# Patient Record
Sex: Female | Born: 1980 | Race: Black or African American | Hispanic: No | Marital: Single | Smoking: Former smoker
Health system: Southern US, Community
[De-identification: ages and names within clinical notes are randomized; demographics above are authoritative.]

---

## 2000-05-03 ENCOUNTER — Emergency Department (HOSPITAL_COMMUNITY): Admission: EM | Admit: 2000-05-03 | Discharge: 2000-05-03 | Payer: Self-pay | Admitting: *Deleted

## 2000-07-11 ENCOUNTER — Emergency Department (HOSPITAL_COMMUNITY): Admission: EM | Admit: 2000-07-11 | Discharge: 2000-07-11 | Payer: Self-pay | Admitting: Emergency Medicine

## 2003-07-21 ENCOUNTER — Emergency Department (HOSPITAL_COMMUNITY): Admission: EM | Admit: 2003-07-21 | Discharge: 2003-07-21 | Payer: Self-pay | Admitting: Emergency Medicine

## 2015-07-06 ENCOUNTER — Emergency Department (HOSPITAL_BASED_OUTPATIENT_CLINIC_OR_DEPARTMENT_OTHER): Payer: Medicaid Other

## 2015-07-06 ENCOUNTER — Emergency Department (HOSPITAL_BASED_OUTPATIENT_CLINIC_OR_DEPARTMENT_OTHER)
Admission: EM | Admit: 2015-07-06 | Discharge: 2015-07-06 | Disposition: A | Discharge status: Home / Self Care | Payer: Medicaid Other | Attending: Emergency Medicine | Admitting: Emergency Medicine

## 2015-07-06 ENCOUNTER — Encounter (HOSPITAL_BASED_OUTPATIENT_CLINIC_OR_DEPARTMENT_OTHER): Payer: Self-pay | Admitting: *Deleted

## 2015-07-06 DIAGNOSIS — Z791 Long term (current) use of non-steroidal anti-inflammatories (NSAID): Secondary | ICD-10-CM | POA: Diagnosis not present

## 2015-07-06 DIAGNOSIS — O26891 Other specified pregnancy related conditions, first trimester: Secondary | ICD-10-CM | POA: Insufficient documentation

## 2015-07-06 DIAGNOSIS — O211 Hyperemesis gravidarum with metabolic disturbance: Secondary | ICD-10-CM

## 2015-07-06 DIAGNOSIS — E860 Dehydration: Secondary | ICD-10-CM | POA: Insufficient documentation

## 2015-07-06 DIAGNOSIS — O0001 Abdominal pregnancy with intrauterine pregnancy: Secondary | ICD-10-CM | POA: Diagnosis not present

## 2015-07-06 DIAGNOSIS — M549 Dorsalgia, unspecified: Secondary | ICD-10-CM | POA: Diagnosis not present

## 2015-07-06 DIAGNOSIS — Z3A01 Less than 8 weeks gestation of pregnancy: Secondary | ICD-10-CM | POA: Insufficient documentation

## 2015-07-06 DIAGNOSIS — Z3491 Encounter for supervision of normal pregnancy, unspecified, first trimester: Secondary | ICD-10-CM

## 2015-07-06 DIAGNOSIS — Z79891 Long term (current) use of opiate analgesic: Secondary | ICD-10-CM | POA: Insufficient documentation

## 2015-07-06 DIAGNOSIS — O210 Mild hyperemesis gravidarum: Secondary | ICD-10-CM | POA: Diagnosis not present

## 2015-07-06 DIAGNOSIS — O99281 Endocrine, nutritional and metabolic diseases complicating pregnancy, first trimester: Secondary | ICD-10-CM | POA: Diagnosis not present

## 2015-07-06 DIAGNOSIS — Z87891 Personal history of nicotine dependence: Secondary | ICD-10-CM | POA: Insufficient documentation

## 2015-07-06 LAB — URINALYSIS, ROUTINE W REFLEX MICROSCOPIC
BILIRUBIN URINE: NEGATIVE
GLUCOSE, UA: NEGATIVE mg/dL
Hgb urine dipstick: NEGATIVE
KETONES UR: NEGATIVE mg/dL
Leukocytes, UA: NEGATIVE
NITRITE: NEGATIVE
PH: 8 (ref 5.0–8.0)
Protein, ur: NEGATIVE mg/dL
Specific Gravity, Urine: 1.018 (ref 1.005–1.030)

## 2015-07-06 LAB — CBC WITH DIFFERENTIAL/PLATELET
Band Neutrophils: 0 %
Basophils Absolute: 0 10*3/uL (ref 0.0–0.1)
Basophils Relative: 0 %
Blasts: 0 %
Eosinophils Absolute: 0 10*3/uL (ref 0.0–0.7)
Eosinophils Relative: 0 %
HEMATOCRIT: 36.8 % (ref 36.0–46.0)
HEMOGLOBIN: 11.9 g/dL — AB (ref 12.0–15.0)
Lymphocytes Relative: 27 %
Lymphs Abs: 1.4 10*3/uL (ref 0.7–4.0)
MCH: 23.6 pg — AB (ref 26.0–34.0)
MCHC: 32.3 g/dL (ref 30.0–36.0)
MCV: 73 fL — ABNORMAL LOW (ref 78.0–100.0)
MYELOCYTES: 0 %
Metamyelocytes Relative: 0 %
Monocytes Absolute: 0.4 10*3/uL (ref 0.1–1.0)
Monocytes Relative: 8 %
NEUTROS PCT: 65 %
NRBC: 0 /100{WBCs}
Neutro Abs: 3.2 10*3/uL (ref 1.7–7.7)
PROMYELOCYTES ABS: 0 %
Platelets: 145 10*3/uL — ABNORMAL LOW (ref 150–400)
RBC: 5.04 MIL/uL (ref 3.87–5.11)
RDW: 14.6 % (ref 11.5–15.5)
WBC: 5 10*3/uL (ref 4.0–10.5)

## 2015-07-06 LAB — COMPREHENSIVE METABOLIC PANEL
ALK PHOS: 34 U/L — AB (ref 38–126)
ALT: 11 U/L — AB (ref 14–54)
AST: 13 U/L — ABNORMAL LOW (ref 15–41)
Albumin: 4.3 g/dL (ref 3.5–5.0)
Anion gap: 8 (ref 5–15)
BILIRUBIN TOTAL: 0.8 mg/dL (ref 0.3–1.2)
BUN: 6 mg/dL (ref 6–20)
CALCIUM: 9.2 mg/dL (ref 8.9–10.3)
CO2: 26 mmol/L (ref 22–32)
CREATININE: 0.67 mg/dL (ref 0.44–1.00)
Chloride: 98 mmol/L — ABNORMAL LOW (ref 101–111)
Glucose, Bld: 98 mg/dL (ref 65–99)
Potassium: 3.7 mmol/L (ref 3.5–5.1)
Sodium: 132 mmol/L — ABNORMAL LOW (ref 135–145)
TOTAL PROTEIN: 7.4 g/dL (ref 6.5–8.1)

## 2015-07-06 LAB — HCG, QUANTITATIVE, PREGNANCY: HCG, BETA CHAIN, QUANT, S: 86141 m[IU]/mL — AB (ref <5)

## 2015-07-06 LAB — PREGNANCY, URINE: Preg Test, Ur: POSITIVE — AB

## 2015-07-06 MED ORDER — ONDANSETRON HCL 4 MG PO TABS: 4.0000 mg | ORAL_TABLET | Freq: Four times a day (QID) | ORAL | Status: AC

## 2015-07-06 MED ORDER — ONDANSETRON HCL 4 MG/2ML IJ SOLN
4.0000 mg | Freq: Once | INTRAMUSCULAR | Status: AC
  Administered 2015-07-06: 4 mg via INTRAVENOUS
  Filled 2015-07-06: qty 2

## 2015-07-06 MED ORDER — PRENATAL COMPLETE 14-0.4 MG PO TABS: 1.0000 | ORAL_TABLET | Freq: Every day | ORAL | Status: AC

## 2015-07-06 MED ORDER — SODIUM CHLORIDE 0.9 % IV BOLUS (SEPSIS)
1000.0000 mL | Freq: Once | INTRAVENOUS | Status: AC
  Administered 2015-07-06: 1000 mL via INTRAVENOUS

## 2015-07-06 NOTE — ED Provider Notes (Signed)
CSN: [id number]     Arrival date & time 07/06/15  1730 History  By signing my name below, I, Julie Hood, attest that this documentation has been prepared under the direction and in the presence of  . Electronically Signed: Freida Busmaniana Hood, Scribe. 07/06/2015. 6:03 PM.    Chief Complaint  Patient presents with  . Emesis    The history is provided by the patient. No language interpreter was used.   HPI Comments:  Julie BrickYolanda Blair is a 35 y.o. female who presents to the Emergency Department complaining of persistent nausea x 2 weeks. She has taken promethazine without relief. She has also taken a home pregnancy test that was positive; states she missed her last period but notes she has irregular periods. Pt notes symptom today is simialr to past UTI.    History reviewed. No pertinent past medical history. History reviewed. No pertinent past surgical history. History reviewed. No pertinent family history. Social History  Substance Use Topics  . Smoking status: Former Games developermoker  . Smokeless tobacco: None  . Alcohol Use: No   OB History    No data available     Review of Systems  Constitutional: Negative for fever and chills.  Gastrointestinal: Positive for nausea.  Genitourinary: Negative for dysuria, vaginal bleeding and vaginal discharge.  All other systems reviewed and are negative.  Allergies  Review of patient's allergies indicates no known allergies.  Home Medications   Prior to Admission medications   Medication Sig Start Date End Date Taking? Authorizing Provider  diclofenac (CATAFLAM) 50 MG tablet Take 50 mg by mouth 3 (three) times daily.   Yes Historical Provider, MD  oxyCODONE (OXY IR/ROXICODONE) 5 MG immediate release tablet Take 15 mg by mouth every 4 (four) hours as needed for severe pain.   Yes Historical Provider, MD  ondansetron (ZOFRAN) 4 MG tablet Take 1 tablet (4 mg total) by mouth every 6 (six) hours. 07/06/15   Jacalyn LefevreJulie Destaney Sarkis, MD  Prenatal Vit-Fe Fumarate-FA  (PRENATAL COMPLETE) 14-0.4 MG TABS Take 1 tablet by mouth daily. 07/06/15   Jacalyn LefevreJulie Dekota Shenk, MD   BP 108/57 mmHg  Pulse 82  Temp(Src) 98.1 F (36.7 C) (Oral)  Resp 18  Ht 5\' 7"  (1.702 m)  Wt 140 lb (63.504 kg)  BMI 21.92 kg/m2  SpO2 98%  LMP 05/19/2015 Physical Exam  Constitutional: She is oriented to person, place, and time. She appears well-developed and well-nourished. No distress.  HENT:  Head: Normocephalic and atraumatic.  Mouth/Throat: Mucous membranes are dry.  Eyes: Conjunctivae are normal.  Cardiovascular: Normal rate, regular rhythm and normal heart sounds.   Pulmonary/Chest: Effort normal and breath sounds normal. No respiratory distress.  Abdominal: Soft. She exhibits no distension. There is no tenderness.  Neurological: She is alert and oriented to person, place, and time.  Skin: Skin is warm and dry.  Psychiatric: She has a normal mood and affect.  Nursing note and vitals reviewed.   ED Course  Procedures (including critical care time)  DIAGNOSTIC STUDIES:  Oxygen Saturation is 99% on RA, normal by my interpretation.    COORDINATION OF CARE:  5:55 PM Discussed treatment plan with pt at bedside and pt agreed to plan.  Labs Review Labs Reviewed  PREGNANCY, URINE - Abnormal; Notable for the following:    Preg Test, Ur POSITIVE (*)    All other components within normal limits  HCG, QUANTITATIVE, PREGNANCY - Abnormal; Notable for the following:    hCG, Beta Nyra JabsChain, Quant, [place] [phone] (*)  All other components within normal limits  COMPREHENSIVE METABOLIC PANEL - Abnormal; Notable for the following:    Sodium 132 (*)    Chloride 98 (*)    AST 13 (*)    ALT 11 (*)    Alkaline Phosphatase 34 (*)    All other components within normal limits  CBC WITH DIFFERENTIAL/PLATELET - Abnormal; Notable for the following:    Hemoglobin 11.9 (*)    MCV 73.0 (*)    MCH 23.6 (*)    Platelets 145 (*)    All other components within normal limits  URINALYSIS, ROUTINE W  REFLEX MICROSCOPIC (NOT AT [facility])    Imaging Review US Ob Comp Less 14 Wks  07/06/2015  CLINICAL DATA:  Low back pain and nausea for 2 weeks. Irregular menses. Gestational age by LMP of 9 weeks 5 days. EXAM: OBSTETRIC <14 WK [place] AND TRANSVAGINAL OB US TECHNIQUE: Both transabdominal and transvaginal ultrasound examinations were performed for complete evaluation of the gestation as well as the maternal uterus, adnexal regions, and pelvic cul-de-sac. Transvaginal technique was performed to assess early pregnancy. COMPARISON:  None. FINDINGS: Intrauterine gestational sac: Single Yolk sac:  Visualized Embryo:  Visualized Cardiac Activity: Visualized Heart Rate: 145  bpm CRL:  11  mm   7 w   1 d                  [place] EDC: 02/21/2016 Subchorionic hemorrhage:  None visualized. Maternal uterus/adnexae: No fibroids identified. Right ovary is normal appearance. Probable left ovarian corpus luteum noted. No suspicious adnexal mass or abnormal free fluid identified. IMPRESSION: Single living IUP measuring 7 weeks 1 day with [place] EDC of 02/21/2016. This is approximately 2.5 weeks behind menstrual dates. Electronically Signed   By: Myles Rosenthal M.D.   On: 07/06/2015 19:37   US Ob Transvaginal  07/06/2015  CLINICAL DATA:  Low back pain and nausea for 2 weeks. Irregular menses. Gestational age by LMP of 9 weeks 5 days. EXAM: OBSTETRIC <14 WK [place] AND TRANSVAGINAL OB US TECHNIQUE: Both transabdominal and transvaginal ultrasound examinations were performed for complete evaluation of the gestation as well as the maternal uterus, adnexal regions, and pelvic cul-de-sac. Transvaginal technique was performed to assess early pregnancy. COMPARISON:  None. FINDINGS: Intrauterine gestational sac: Single Yolk sac:  Visualized Embryo:  Visualized Cardiac Activity: Visualized Heart Rate: 145  bpm CRL:  11  mm   7 w   1 d                  [place] EDC: 02/21/2016 Subchorionic hemorrhage:  None visualized. Maternal uterus/adnexae: No fibroids identified.  Right ovary is normal appearance. Probable left ovarian corpus luteum noted. No suspicious adnexal mass or abnormal free fluid identified. IMPRESSION: Single living IUP measuring 7 weeks 1 day with [place] EDC of 02/21/2016. This is approximately 2.5 weeks behind menstrual dates. Electronically Signed   By: Myles Rosenthal M.D.   On: 07/06/2015 19:37   I have personally reviewed and evaluated these images and lab results as part of my medical decision-making.   EKG Interpretation None      MDM  PT IS FEELING BETTER.  RESULTS OF [place] GIVEN TO PT.  PT KNOWS TO RETURN IF WORSE.  Final diagnoses:  Pregnancy related back pain in first trimester, antepartum  Hyperemesis gravidarum with dehydration  Normal IUP (intrauterine pregnancy) on prenatal ultrasound, first trimester   I personally performed the services described in this documentation, which was scribed in my presence. The  recorded information has been reviewed and is accurate.     Jacalyn Lefevre, MD 07/06/15 [phone]

## 2015-07-06 NOTE — ED Notes (Signed)
Pt c/o n/v/d  X 2 weeks , LMP 1.5 months ago

## 2016-05-08 ENCOUNTER — Other Ambulatory Visit: Payer: Self-pay | Admitting: Psychiatry

## 2016-05-08 DIAGNOSIS — M545 Low back pain, unspecified: Secondary | ICD-10-CM

## 2016-05-11 ENCOUNTER — Other Ambulatory Visit: Payer: Self-pay | Admitting: Psychiatry

## 2016-05-11 DIAGNOSIS — G8929 Other chronic pain: Secondary | ICD-10-CM

## 2016-05-11 DIAGNOSIS — M545 Low back pain: Principal | ICD-10-CM

## 2016-06-01 ENCOUNTER — Inpatient Hospital Stay
Admission: RE | Admit: 2016-06-01 | Discharge: 2016-06-01 | Disposition: A | Discharge status: Home / Self Care | Payer: Medicaid Other | Source: Ambulatory Visit | Attending: Psychiatry | Admitting: Psychiatry

## 2016-06-02 ENCOUNTER — Inpatient Hospital Stay
Admission: RE | Admit: 2016-06-02 | Discharge: 2016-06-02 | Disposition: A | Discharge status: Home / Self Care | Payer: Medicaid Other | Source: Ambulatory Visit | Attending: Psychiatry | Admitting: Psychiatry
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
## 2016-06-15 ENCOUNTER — Ambulatory Visit
Admission: RE | Admit: 2016-06-15 | Discharge: 2016-06-15 | Disposition: A | Discharge status: Home / Self Care | Payer: Medicaid Other | Source: Ambulatory Visit | Attending: Psychiatry | Admitting: Psychiatry

## 2016-06-15 DIAGNOSIS — M545 Low back pain, unspecified: Secondary | ICD-10-CM

## 2016-06-22 ENCOUNTER — Ambulatory Visit
Admission: RE | Admit: 2016-06-22 | Discharge: 2016-06-22 | Disposition: A | Discharge status: Home / Self Care | Payer: Medicaid Other | Source: Ambulatory Visit | Attending: Psychiatry | Admitting: Psychiatry

## 2016-06-22 DIAGNOSIS — M545 Low back pain: Principal | ICD-10-CM

## 2016-06-22 DIAGNOSIS — G8929 Other chronic pain: Secondary | ICD-10-CM

## 2016-06-22 MED ORDER — METHYLPREDNISOLONE ACETATE 40 MG/ML INJ SUSP (RADIOLOG: 120.0000 mg | Freq: Once | INTRAMUSCULAR | Status: DC

## 2016-06-22 MED ORDER — IOPAMIDOL (ISOVUE-M 200) INJECTION 41%: 1.0000 mL | Freq: Once | INTRAMUSCULAR | Status: DC

## 2016-06-22 NOTE — Discharge Instructions (Signed)

## 2016-06-28 LAB — GLUCOSE, POCT (MANUAL RESULT ENTRY): POC Glucose: 102 mg/dl — AB (ref 70–99)

## 2016-09-05 ENCOUNTER — Other Ambulatory Visit: Payer: Self-pay | Admitting: Psychiatry

## 2016-09-05 DIAGNOSIS — M545 Low back pain, unspecified: Secondary | ICD-10-CM

## 2016-09-14 ENCOUNTER — Ambulatory Visit
Admission: RE | Admit: 2016-09-14 | Discharge: 2016-09-14 | Disposition: A | Discharge status: Home / Self Care | Payer: Medicaid Other | Source: Ambulatory Visit | Attending: Psychiatry | Admitting: Psychiatry

## 2016-09-14 DIAGNOSIS — M545 Low back pain, unspecified: Secondary | ICD-10-CM

## 2016-09-14 MED ORDER — IOPAMIDOL (ISOVUE-M 200) INJECTION 41%
1.0000 mL | Freq: Once | INTRAMUSCULAR | Status: AC
  Administered 2016-09-14: 1 mL via EPIDURAL

## 2016-09-14 MED ORDER — METHYLPREDNISOLONE ACETATE 40 MG/ML INJ SUSP (RADIOLOG
120.0000 mg | Freq: Once | INTRAMUSCULAR | Status: AC
  Administered 2016-09-14: 120 mg via EPIDURAL

## 2016-09-14 NOTE — Discharge Instructions (Signed)

## 2019-04-06 IMAGING — XA Imaging study
1 series · 1 of 1 positions shown · non-contrast
Comparison: none

CLINICAL DATA: Lumbosacral spondylosis without myelopathy. Chronic
low back pain. Right lower extremity radiculitis. Displacement of
the L3-4 lumbar disc.

[Series 1: ortho adipose · 1 of 1 slices shown]
[im 1/1]
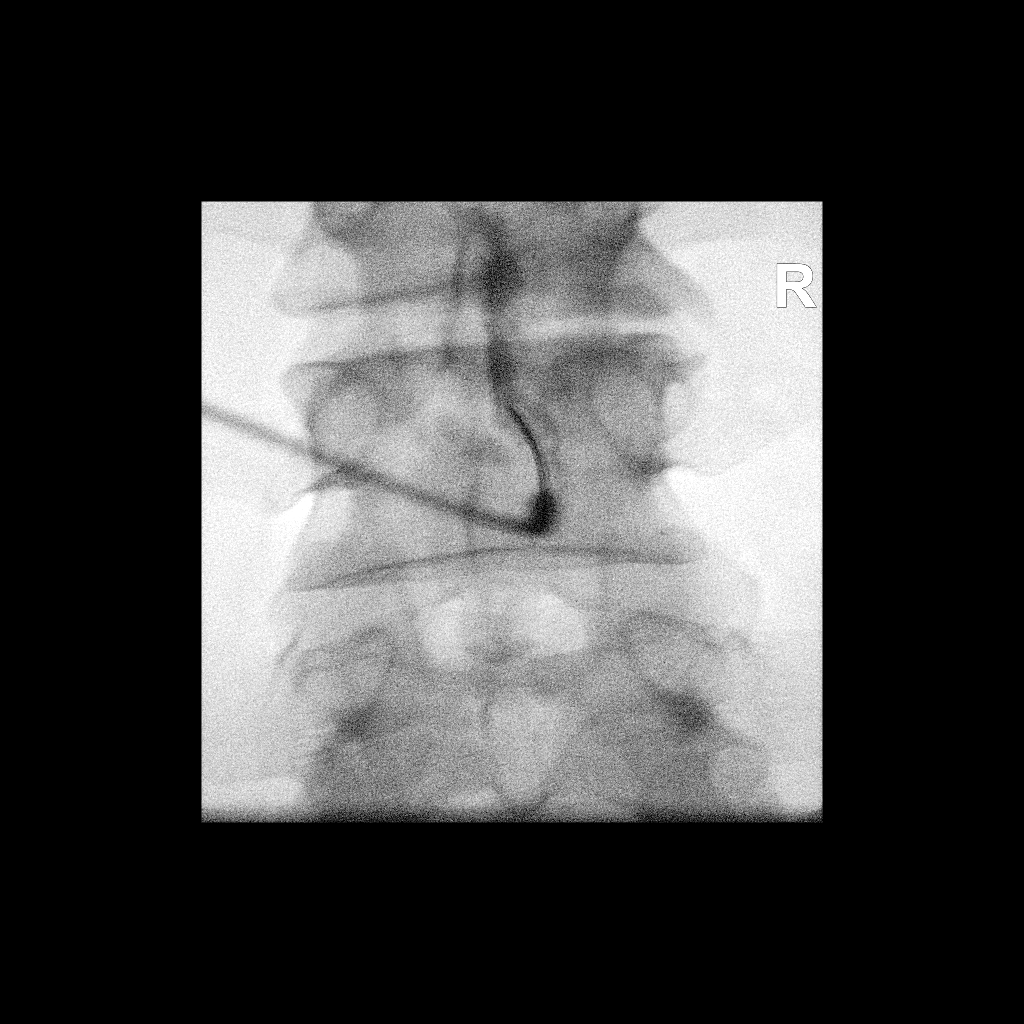

[1 of 1 positions shown; findings below may reference images not displayed]

FLUOROSCOPY TIME:  Radiation Exposure Index (as provided by the
fluoroscopic device): 10.31 uGy*m2

Fluoroscopy Time:  17 seconds

Number of Acquired Images:  0

PROCEDURE:
The procedure, risks, benefits, and alternatives were explained to
the patient. Questions regarding the procedure were encouraged and
answered. The patient understands and consents to the procedure.

LUMBAR EPIDURAL INJECTION:

An interlaminar approach was performed on the right at L3-4. The
overlying skin was cleansed and anesthetized. A 20 gauge epidural
needle was advanced using loss-of-resistance technique.

DIAGNOSTIC EPIDURAL INJECTION:

Injection of Isovue-M 200 shows a good epidural pattern with spread
above and below the level of needle placement, primarily on the
right no vascular opacification is seen.

THERAPEUTIC EPIDURAL INJECTION:

120 Mg of Depo-Medrol mixed with 3 mL 1% lidocaine were instilled.
The procedure was well-tolerated, and the patient was discharged
thirty minutes following the injection in good condition.

COMPLICATIONS:
None
IMPRESSION: Technically successful epidural injection on the right L3-4 # 1

## 2021-07-17 ENCOUNTER — Emergency Department (HOSPITAL_COMMUNITY)
Admission: EM | Admit: 2021-07-17 | Discharge: 2021-07-17 | Disposition: A | Discharge status: Home / Self Care | Payer: Medicaid Other | Attending: Emergency Medicine | Admitting: Emergency Medicine

## 2021-07-17 ENCOUNTER — Encounter (HOSPITAL_COMMUNITY): Payer: Self-pay

## 2021-07-17 DIAGNOSIS — F1193 Opioid use, unspecified with withdrawal: Secondary | ICD-10-CM

## 2021-07-17 DIAGNOSIS — R112 Nausea with vomiting, unspecified: Secondary | ICD-10-CM | POA: Diagnosis present

## 2021-07-17 DIAGNOSIS — M7918 Myalgia, other site: Secondary | ICD-10-CM | POA: Diagnosis not present

## 2021-07-17 DIAGNOSIS — F1123 Opioid dependence with withdrawal: Secondary | ICD-10-CM | POA: Insufficient documentation

## 2021-07-17 MED ORDER — OXYCODONE HCL 5 MG PO TABS
5.0000 mg | ORAL_TABLET | Freq: Once | ORAL | Status: AC
  Administered 2021-07-17: 5 mg via ORAL
  Filled 2021-07-17: qty 1

## 2021-07-17 MED ORDER — ONDANSETRON HCL 4 MG/2ML IJ SOLN
4.0000 mg | Freq: Once | INTRAMUSCULAR | Status: AC
  Administered 2021-07-17: 4 mg via INTRAVENOUS
  Filled 2021-07-17: qty 2

## 2021-07-17 MED ORDER — ONDANSETRON 4 MG PO TBDP: 4.0000 mg | ORAL_TABLET | Freq: Once | ORAL | Status: DC

## 2021-07-17 MED ORDER — ONDANSETRON 4 MG PO TBDP
4.0000 mg | ORAL_TABLET | Freq: Once | ORAL | Status: AC
  Administered 2021-07-17: 4 mg via ORAL
  Filled 2021-07-17: qty 1

## 2021-07-17 MED ORDER — SODIUM CHLORIDE 0.9 % IV BOLUS
1000.0000 mL | Freq: Once | INTRAVENOUS | Status: AC
  Administered 2021-07-17: 1000 mL via INTRAVENOUS

## 2021-07-17 MED ORDER — ONDANSETRON 4 MG PO TBDP: 4.0000 mg | ORAL_TABLET | Freq: Three times a day (TID) | ORAL | 0 refills | Status: AC | PRN

## 2021-07-17 NOTE — ED Notes (Signed)
Pt. Requested zofran before leaving ED. When nurse brought zofran back pt. Requested to be wheeled out of ED without receiving med. Discharge paperwork reviewed and given to pt. Prescriptions also reviewed. Pt. Showed verbal understanding of paperwork with no further questions for ED staff pt. Left ED POV.

## 2021-07-17 NOTE — ED Triage Notes (Signed)
Pt arrived via EMS, from home, c/o oxycodone withdrawal, states she has been on it chronically, due to arthritis, ran out out of rx last Tuesday. States N/v, chills. States friend gave her an oxycodone yesterday she does not believe was actually oxycodone.

## 2021-07-17 NOTE — Discharge Instructions (Addendum)
Please follow-up with your doctor to get a refill of your narcotic pain medication.  Please use Zofran as needed for nausea and vomiting.  Please return to the emergency department for further evaluation.

## 2021-07-17 NOTE — ED Notes (Signed)
Patients daughter called and requesting an update. Patient given verbal consent to speak with family.

## 2021-07-17 NOTE — ED Notes (Signed)
Pt actively vomiting into trash can in triage room. Pt given OTD zofran.

## 2021-07-17 NOTE — ED Provider Triage Note (Signed)
Emergency Medicine Provider Triage Evaluation Note  Julie Hood , a 41 y.o. female  was evaluated in triage.  Pt complains of withdrawal symptoms.  Patient last took oxycodone yesterday.  She was previously prescribed this for chronic arthritis and scoliosis.  She reports associated chills, nausea, vomiting, myalgias, and general malaise.  Review of Systems  Positive:  Negative: See above   Physical Exam  BP 93/75 (BP Location: Left Arm)   Pulse 81   Temp 98.7 F (37.1 C) (Oral)   Resp 18   SpO2 95%  Gen:   Awake, no distress   Resp:  Normal effort  MSK:   Moves extremities without difficulty  Other:    Medical Decision Making  Medically screening exam initiated at 10:13 AM.  Appropriate orders placed.  Eleonore Masterson was informed that the remainder of the evaluation will be completed by another provider, this initial triage assessment does not replace that evaluation, and the importance of remaining in the ED until their evaluation is complete.     Honor Loh [place], [place] 07/17/21 1015
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
